# Patient Record
Sex: Female | Born: 1990 | Race: White | Hispanic: No | Marital: Single | Smoking: Current every day smoker
Health system: Southern US, Community
[De-identification: ages and names within clinical notes are randomized; demographics above are authoritative.]

---

## 2017-02-22 ENCOUNTER — Emergency Department (HOSPITAL_COMMUNITY)
Admission: EM | Admit: 2017-02-22 | Discharge: 2017-02-22 | Disposition: A | Discharge status: Home / Self Care | Payer: Medicaid Other | Attending: Emergency Medicine | Admitting: Emergency Medicine

## 2017-02-22 ENCOUNTER — Encounter (HOSPITAL_COMMUNITY): Payer: Self-pay | Admitting: Emergency Medicine

## 2017-02-22 ENCOUNTER — Emergency Department (HOSPITAL_COMMUNITY): Payer: Medicaid Other

## 2017-02-22 DIAGNOSIS — Y939 Activity, unspecified: Secondary | ICD-10-CM | POA: Diagnosis not present

## 2017-02-22 DIAGNOSIS — S62336A Displaced fracture of neck of fifth metacarpal bone, right hand, initial encounter for closed fracture: Secondary | ICD-10-CM | POA: Insufficient documentation

## 2017-02-22 DIAGNOSIS — Y929 Unspecified place or not applicable: Secondary | ICD-10-CM | POA: Diagnosis not present

## 2017-02-22 DIAGNOSIS — Y999 Unspecified external cause status: Secondary | ICD-10-CM | POA: Diagnosis not present

## 2017-02-22 DIAGNOSIS — X838XXA Intentional self-harm by other specified means, initial encounter: Secondary | ICD-10-CM | POA: Insufficient documentation

## 2017-02-22 DIAGNOSIS — F17210 Nicotine dependence, cigarettes, uncomplicated: Secondary | ICD-10-CM | POA: Insufficient documentation

## 2017-02-22 DIAGNOSIS — S6991XA Unspecified injury of right wrist, hand and finger(s), initial encounter: Secondary | ICD-10-CM | POA: Diagnosis present

## 2017-02-22 MED ORDER — ACETAMINOPHEN 325 MG PO TABS: 650.0000 mg | ORAL_TABLET | Freq: Four times a day (QID) | ORAL | Status: AC | PRN

## 2017-02-22 MED ORDER — IBUPROFEN 200 MG PO TABS: 600.0000 mg | ORAL_TABLET | Freq: Four times a day (QID) | ORAL | Status: AC | PRN

## 2017-02-22 MED ORDER — OXYCODONE-ACETAMINOPHEN 5-325 MG PO TABS
2.0000 | ORAL_TABLET | Freq: Once | ORAL | Status: AC
  Administered 2017-02-22: 2 via ORAL
  Filled 2017-02-22: qty 2

## 2017-02-22 NOTE — ED Notes (Signed)
Pt taken to xray via wheelchair

## 2017-02-22 NOTE — Consult Note (Signed)
ORTHOPAEDIC CONSULTATION HISTORY & PHYSICAL REQUESTING PHYSICIAN: Arby Barrette, MD  Chief Complaint: Right hand injury  HPI: Theresa French is a 26 y.o. female who injured her right hand striking immovable object a few days ago.  She presents the emergency department because of continued pain, swelling, and bruising.  No past medical history on file. History reviewed. No pertinent surgical history. Social History   Social History  . Marital status: Single    Spouse name: N/A  . Number of children: N/A  . Years of education: N/A   Social History Main Topics  . Smoking status: Current Every Day Smoker    Packs/day: 1.00    Types: Cigarettes  . Smokeless tobacco: Never Used  . Alcohol use No  . Drug use: Unknown  . Sexual activity: Not Asked   Other Topics Concern  . None   Social History Narrative  . None   No family history on file. Allergies  Allergen Reactions  . Penicillins Hives  . Tramadol Other (See Comments)    seizures   Prior to Admission medications   Not on File   Dg Forearm Right  Result Date: 02/22/2017 CLINICAL DATA:  Burning sensation in the lateral right wrist. Punched a wall 4 days ago. Initial encounter. EXAM: RIGHT FOREARM - 2 VIEW COMPARISON:  None. FINDINGS: There is no evidence of forearm fracture or malalignment. Soft tissues are unremarkable. Known fifth metacarpal neck fracture. IMPRESSION: Negative. Electronically Signed   By: Marnee Spring M.D.   On: 02/22/2017 17:55   Dg Wrist Complete Right  Result Date: 02/22/2017 CLINICAL DATA:  Punched a wall 4 days ago. Medial popping when moving the wrist. Initial encounter. EXAM: RIGHT WRIST - COMPLETE 3+ VIEW COMPARISON:  None. FINDINGS: There is no evidence of fracture or malalignment at the wrist. There is no evidence of arthropathy or other focal bone abnormality. Soft tissues are unremarkable. IMPRESSION: 1. Negative right wrist. 2. Known fifth metacarpal neck fracture. Electronically Signed    By: Marnee Spring M.D.   On: 02/22/2017 17:54   Dg Hand Complete Right  Result Date: 02/22/2017 CLINICAL DATA:  Punched a wall a few days ago. Hand swelling and pain. Initial encounter. EXAM: RIGHT HAND - COMPLETE 3+ VIEW COMPARISON:  None. FINDINGS: Acute fifth metacarpal neck fracture with up to 50 degrees of angulation due to ventral impaction. No dislocation. No opaque foreign body. IMPRESSION: Impacted fifth metacarpal neck fracture. Electronically Signed   By: Marnee Spring M.D.   On: 02/22/2017 15:35    Positive ROS: All other systems have been reviewed and were otherwise negative with the exception of those mentioned in the HPI and as above.  Physical Exam: Vitals: Refer to EMR. Constitutional:  WD, WN, NAD HEENT:  NCAT, EOMI Neuro/Psych:  Alert & oriented to person, place, and time; appropriate mood & affect Lymphatic: No generalized extremity edema or lymphadenopathy Extremities / MSK:  The extremities are normal with respect to appearance, ranges of motion, joint stability, muscle strength/tone, sensation, & perfusion except as otherwise noted:  Right hand swollen, with some ecchymosis that is filtered into the dorsum of several fingers, and the ulnar side of the hand.  Intact light touch sensibility in the radial, median, and ulnar nerve distributions.  With attempts at flexion, the small finger has a touchdown point nicely beside the ring finger, 19 against it slightly, but this is comparable to the contralateral side.  Motion is not full, but if done slowly, is actually pretty good.  Maximum  tenderness at the distal fifth metacarpal.  She is able to extend the MP joint at least in neutral, and flexion is not quite full, limited by discomfort  Assessment: Right fifth metacarpal neck fracture  Plan: Given no significant malrotation, I recommended to her that this fracture be treated nonoperatively.  It has already been a few days, so we discussed analgesic plan the consists of  regularly taken Tylenol and ibuprofen, as well as a splinting program based upon buddy taping the ring and small fingers with Coban.  In this way, the small finger is splinted, but also accommodates motion.  We discussed the importance of instituting early active motion.  My office will call her with a follow-up plan, likely to be roughly in a month with new x-rays of the right hand I indicated to her that my expectation is that she worked sufficiently so that her motion should be full at that time.  Questions were invited and answered.   Cliffton Astersavid A. Janee Mornhompson, MD      Orthopaedic & Hand Surgery [facility] [address] [place], [place]  [id number] Office: [phone] Mobile: [phone]  02/22/2017, 7:12 PM

## 2017-02-22 NOTE — ED Triage Notes (Signed)
Reports hitting a wall 3-4 days ago.  Pain and swelling in right hand.  Some bruising noted.

## 2017-02-22 NOTE — Discharge Instructions (Addendum)
Discharge Instructions   You have been shown how to splint the fracture by buddy taping the ring finger to the small finger with 2 strips of cold band. You may begin gentle motion of your fingers and hand immediately, but you should not do any heavy lifting or gripping.  Elevate your hand to reduce pain & swelling of the digits.  Ice over the operative site may be helpful to reduce pain & swelling.  DO NOT USE HEAT. Pain medicine has been prescribed for you--Tylenol and Advil taken regularly. You may drive a car when you are off of prescription pain medications and can safely control your vehicle with both hands. We will address whether therapy will be required or not when you return to the office.    Please call [phone] during normal business hours or [phone] after hours for any problems.   *Please note that pain medications will not be refilled after hours or on weekends.

## 2017-02-22 NOTE — ED Notes (Signed)
Patient transported to X-ray 

## 2017-02-22 NOTE — ED Provider Notes (Signed)
[facility]-EMERGENCY DEPT Provider Note   CSN: [id number] Arrival date & time: 02/22/17  1350  By signing my name below, I, Cynda Acres, attest that this documentation has been prepared under the direction and in the presence of [employer]. Electronically Signed: Cynda Acres, Scribe. 02/22/17. 5:21 PM.  History   Chief Complaint Chief Complaint  Patient presents with  . Hand Injury   HPI Comments: Theresa French is a 26 y.o. female with no pertinent past medical history, who presents to the Emergency Department complaining of sudden-onset, constant right hand pain s/p injury that occurred three days ago. Patient reports hitting a wall out of anger three days ago. Patient reports gradually worsening right hand pain. Last oral intake was at 10:30 AM. Patient reports associated right hand swelling, wrist pain, and right hand tingling. Patient reports applying ice the the area and taking ibuprofen and tylenol, last dose was prior to arrival. Patient states she has had 2,000 mg of tylenol today. Patient states her pain is worse with movement, nothing improves her pain. Patient denies any fever, chills, numbness, weakness, elbow pain, or any additional injuries.   The history is provided by the patient. No language interpreter was used.    No past medical history on file.  There are no active problems to display for this patient.   History reviewed. No pertinent surgical history.  OB History    No data available       Home Medications    Prior to Admission medications   Medication Sig Start Date End Date Taking? Authorizing Provider  acetaminophen (TYLENOL) 325 MG tablet Take 2 tablets (650 mg total) by mouth every 6 (six) hours as needed for mild pain or moderate pain. 02/22/17   Mack Hook, MD  ibuprofen (ADVIL) 200 MG tablet Take 3 tablets (600 mg total) by mouth every 6 (six) hours as needed for mild pain or moderate pain. 02/22/17   Mack Hook, MD    Family  History No family history on file.  Social History Social History  Substance Use Topics  . Smoking status: Current Every Day Smoker    Packs/day: 1.00    Types: Cigarettes  . Smokeless tobacco: Never Used  . Alcohol use No     Allergies   Penicillins and Tramadol   Review of Systems Review of Systems  Constitutional: Negative for fever.  Musculoskeletal: Positive for arthralgias (right hand and right wrist) and joint swelling (right hand).  Neurological: Negative for weakness and numbness.     Physical Exam Updated Vital Signs BP 118/67 (BP Location: Left Arm)   Pulse 81   Temp 98.6 F (37 C) (Oral)   Resp 18   Ht 5\' 4"  (1.626 m)   Wt 77.1 kg (170 lb)   LMP 02/14/2017   SpO2 98%   BMI 29.18 kg/m   Physical Exam  Constitutional: She appears well-developed and well-nourished. No distress.  HENT:  Head: Normocephalic and atraumatic.  Mouth/Throat: Oropharynx is clear and moist. No oropharyngeal exudate.  Eyes: Conjunctivae are normal. Pupils are equal, round, and reactive to light. Right eye exhibits no discharge. Left eye exhibits no discharge. No scleral icterus.  Neck: Normal range of motion. Neck supple. No thyromegaly present.  Cardiovascular: Normal rate, regular rhythm, normal heart sounds and intact distal pulses.  Exam reveals no gallop and no friction rub.   No murmur heard. Pulmonary/Chest: Effort normal and breath sounds normal. No stridor. No respiratory distress. She has no wheezes. She has no rales.  Musculoskeletal: She exhibits edema and tenderness. She exhibits no deformity.  Tenderness to right 5th metacarpal ulnar aspect of the right wrist. No anatomical suff box tenderness. Tenderness to the mid forearm. No significant right elbow tenderness. Sensation intact. Ecchymosis noted to MCP joints 3-5. Deviation noted of the 5th digit with closed fist. Flexion and extension intact, but limited due to pain. Cap refill normal.   Lymphadenopathy:    She  has no cervical adenopathy.  Neurological: She is alert. Coordination normal.  Skin: Skin is warm and dry. No rash noted. She is not diaphoretic. No pallor.  Psychiatric: She has a normal mood and affect.  Nursing note and vitals reviewed.    ED Treatments / Results  DIAGNOSTIC STUDIES: Oxygen Saturation is 100% on RA, normal by my interpretation.    COORDINATION OF CARE: 5:14 PM Discussed treatment plan with pt at bedside and pt agreed to plan, which includes imaging, pain medication, and an ortho consult.   Labs (all labs ordered are listed, but only abnormal results are displayed) Labs Reviewed - No data to display  EKG  EKG Interpretation None       Radiology Dg Forearm Right  Result Date: 02/22/2017 CLINICAL DATA:  Burning sensation in the lateral right wrist. Punched a wall 4 days ago. Initial encounter. EXAM: RIGHT FOREARM - 2 VIEW COMPARISON:  None. FINDINGS: There is no evidence of forearm fracture or malalignment. Soft tissues are unremarkable. Known fifth metacarpal neck fracture. IMPRESSION: Negative. Electronically Signed   By: Marnee SpringJonathon  Watts M.D.   On: 02/22/2017 17:55   Dg Wrist Complete Right  Result Date: 02/22/2017 CLINICAL DATA:  Punched a wall 4 days ago. Medial popping when moving the wrist. Initial encounter. EXAM: RIGHT WRIST - COMPLETE 3+ VIEW COMPARISON:  None. FINDINGS: There is no evidence of fracture or malalignment at the wrist. There is no evidence of arthropathy or other focal bone abnormality. Soft tissues are unremarkable. IMPRESSION: 1. Negative right wrist. 2. Known fifth metacarpal neck fracture. Electronically Signed   By: Marnee SpringJonathon  Watts M.D.   On: 02/22/2017 17:54   Dg Hand Complete Right  Result Date: 02/22/2017 CLINICAL DATA:  Punched a wall a few days ago. Hand swelling and pain. Initial encounter. EXAM: RIGHT HAND - COMPLETE 3+ VIEW COMPARISON:  None. FINDINGS: Acute fifth metacarpal neck fracture with up to 50 degrees of angulation due to  ventral impaction. No dislocation. No opaque foreign body. IMPRESSION: Impacted fifth metacarpal neck fracture. Electronically Signed   By: Marnee SpringJonathon  Watts M.D.   On: 02/22/2017 15:35    Procedures Procedures (including critical care time)  Medications Ordered in ED Medications  oxyCODONE-acetaminophen (PERCOCET/ROXICET) 5-325 MG per tablet 2 tablet (2 tablets Oral Given 02/22/17 1724)     Initial Impression / Assessment and Plan / ED Course  I have reviewed the triage vital signs and the nursing notes.  Pertinent labs & imaging results that were available during my care of the patient were reviewed by me and considered in my medical decision making (see chart for details).     X-ray of the right hand shows impacted fifth metacarpal neck fracture with up to 50 of angulation. X-rays of the wrist and forearm negative. I consulted Dr. Janee Mornhompson, hand surgeon, who evaluated the patient applied buddy taping. Dr. Janee Mornhompson discussed with the patient and home care and range of motion exercises. His office will contact the patient about setting up a follow-up appointment. Supportive treatment discussed including ice, ibuprofen, Tylenol. Patient understands and agrees  with plan. Patient vitals stable. ED course and discharged in satisfactory condition.  Final Clinical Impressions(s) / ED Diagnoses   Final diagnoses:  Closed displaced fracture of neck of fifth metacarpal bone of right hand, initial encounter    New Prescriptions Discharge Medication List as of 02/22/2017  7:46 PM    START taking these medications   Details  acetaminophen (TYLENOL) 325 MG tablet Take 2 tablets (650 mg total) by mouth every 6 (six) hours as needed for mild pain or moderate pain., Starting Fri 02/22/2017, OTC    ibuprofen (ADVIL) 200 MG tablet Take 3 tablets (600 mg total) by mouth every 6 (six) hours as needed for mild pain or moderate pain., Starting Fri 02/22/2017, OTC       I personally performed the services  described in this documentation, which was scribed in my presence. The recorded information has been reviewed and is accurate.     Emi Holes, PA-C 02/22/17 2146    Arby Barrette, MD 02/23/17 [phone]

## 2017-02-22 NOTE — ED Notes (Signed)
ED Provider at bedside. 

## 2017-02-22 NOTE — ED Notes (Signed)
Initial coband wrapping placed by hand surgeon fell off patient. Jenn, NT attempted to wrap pt fingers in simlar fashion however pt did not allow NT to finish wrapping fingers before leaving room. Pt family given tape and wrapping materials. Pt immediately walked out after finding out she would not be receiving a pain medication prescription. Alex, PA ran to give pt family instructions as pt walked out.

## 2018-05-13 IMAGING — CR DG WRIST COMPLETE 3+V*R*
4 series · 4 of 4 positions shown · non-contrast
Comparison: None.

CLINICAL DATA: Punched a wall 4 days ago. Medial popping when
moving the wrist. Initial encounter.

EXAM:
RIGHT WRIST - COMPLETE 3+ VIEW

[wrist pa]
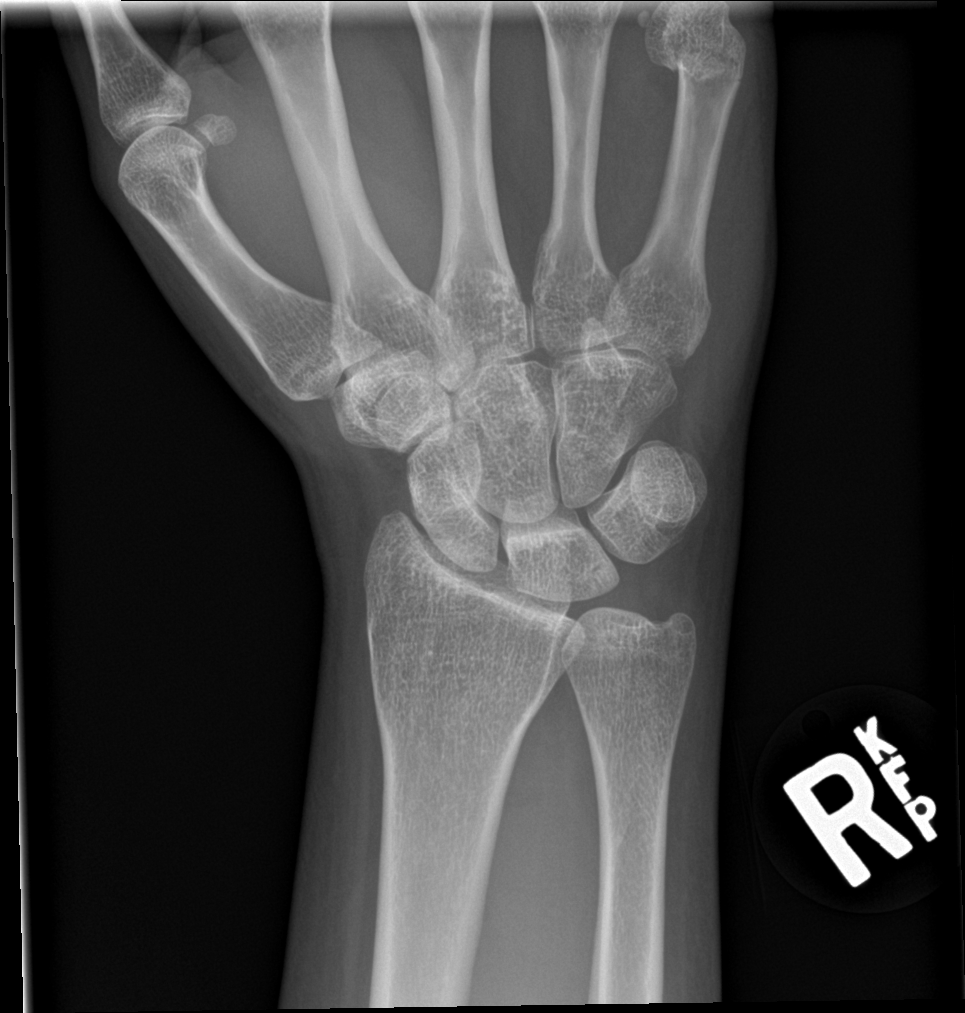

[wrist obl]
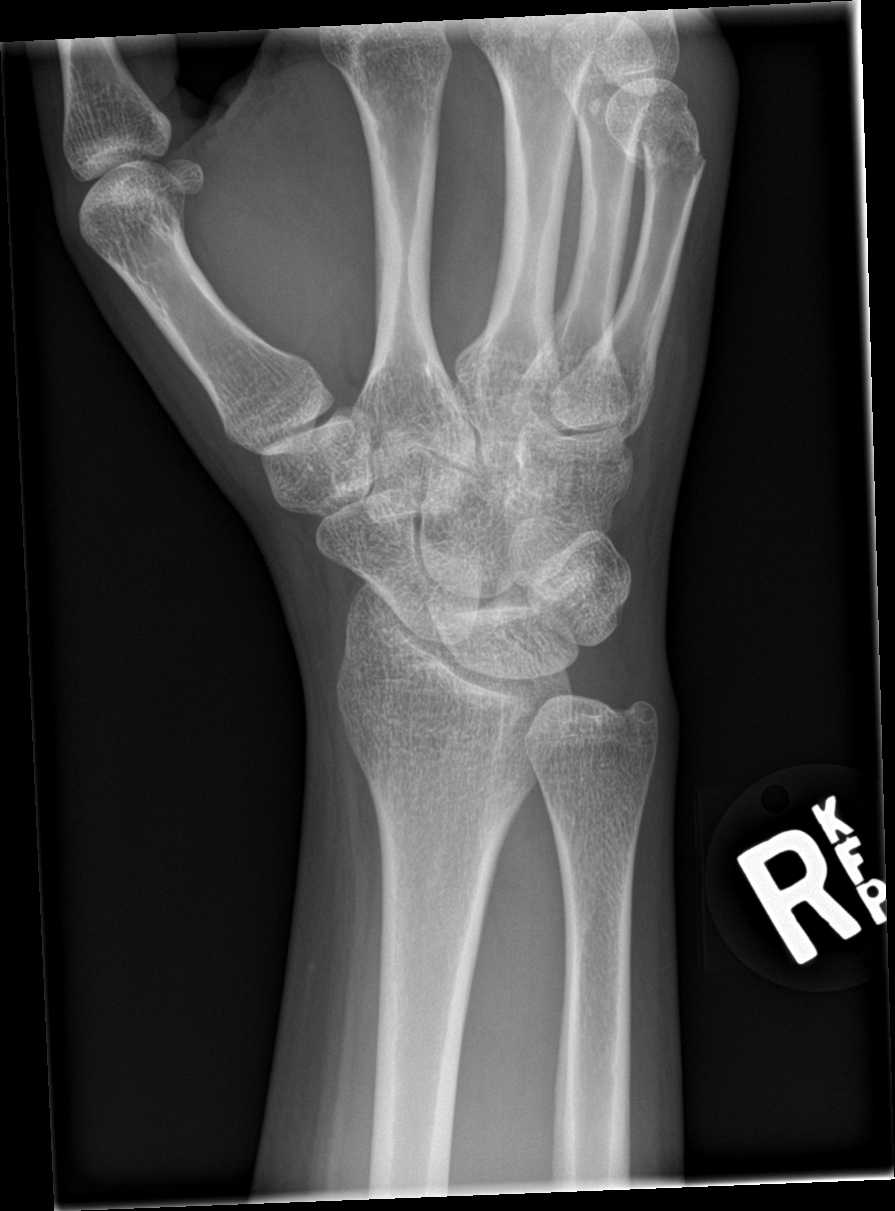

[wrist lat]
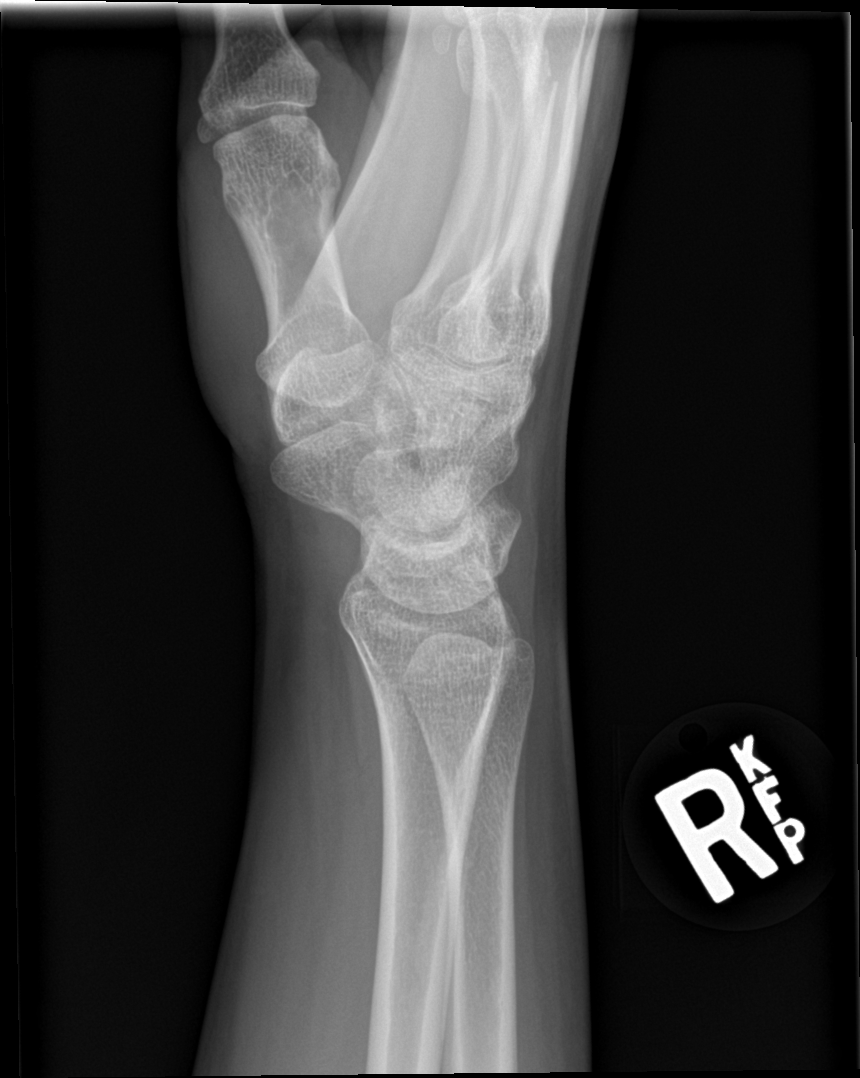

[wrist navicular]
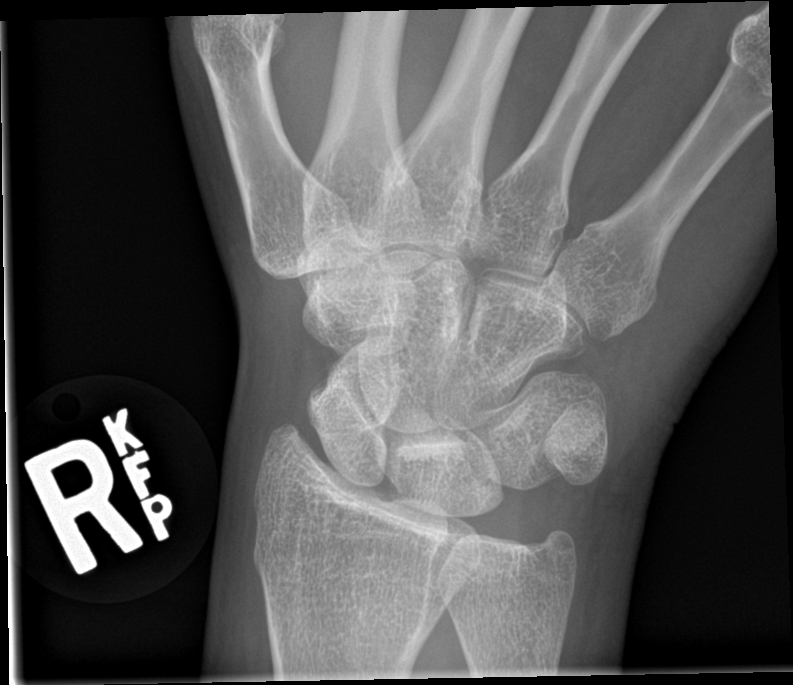

[4 of 4 positions shown; findings below may reference images not displayed]

FINDINGS: There is no evidence of fracture or malalignment at the wrist. There
is no evidence of arthropathy or other focal bone abnormality. Soft
tissues are unremarkable.
IMPRESSION: 1. Negative right wrist.
2. Known fifth metacarpal neck fracture.
